# Patient Record
Sex: Male | Born: 1985 | Race: White | Hispanic: No | Marital: Single | Smoking: Never smoker
Health system: Southern US, Community
[De-identification: ages and names within clinical notes are randomized; demographics above are authoritative.]

## PROBLEM LIST (undated history)

## (undated) HISTORY — PX: ELBOW ARTHROSCOPY: SHX614

---

## 2020-02-10 ENCOUNTER — Other Ambulatory Visit: Payer: Self-pay | Admitting: Orthopedic Surgery

## 2020-02-10 DIAGNOSIS — M25521 Pain in right elbow: Secondary | ICD-10-CM

## 2020-02-22 ENCOUNTER — Ambulatory Visit
Admission: RE | Admit: 2020-02-22 | Discharge: 2020-02-22 | Disposition: A | Discharge status: Home / Self Care | Payer: BC Managed Care – PPO | Source: Ambulatory Visit | Attending: Orthopedic Surgery | Admitting: Orthopedic Surgery

## 2020-02-22 DIAGNOSIS — M25521 Pain in right elbow: Secondary | ICD-10-CM

## 2020-04-10 ENCOUNTER — Other Ambulatory Visit (HOSPITAL_COMMUNITY)
Admission: RE | Admit: 2020-04-10 | Discharge: 2020-04-10 | Disposition: A | Discharge status: Home / Self Care | Payer: BC Managed Care – PPO | Source: Ambulatory Visit | Attending: General Surgery | Admitting: General Surgery

## 2020-04-10 ENCOUNTER — Encounter (HOSPITAL_BASED_OUTPATIENT_CLINIC_OR_DEPARTMENT_OTHER): Payer: Self-pay | Admitting: Orthopaedic Surgery

## 2020-04-10 ENCOUNTER — Other Ambulatory Visit: Payer: Self-pay

## 2020-04-10 DIAGNOSIS — Z20822 Contact with and (suspected) exposure to covid-19: Secondary | ICD-10-CM | POA: Diagnosis not present

## 2020-04-10 DIAGNOSIS — Z01812 Encounter for preprocedural laboratory examination: Secondary | ICD-10-CM | POA: Diagnosis not present

## 2020-04-10 LAB — SARS CORONAVIRUS 2 (TAT 6-24 HRS): SARS Coronavirus 2: NEGATIVE

## 2020-04-10 NOTE — H&P (Signed)
PREOPERATIVE H&P  Chief Complaint: RIGHT ELBOW INFECTION  HPI: Nicholas Hart is a healthy 34 y.o. male who is scheduled for: INCISION AND DRAINAGE RIGHT ELBOW COMPLEX WOUND REPAIR.    Patient had surgery on his right elbow for lysis of adhesions and removal of osteophytes on 03/19/2020 with Dr. Everardo Pacific. His lateral stitches popped early after surgery while working on his range of motion with PT. He has been working on dealing with that with dressing changes.   His symptoms are rated as moderate to severe, and have been worsening.  This is significantly impairing activities of daily living.    Please see clinic note for further details on this patient's care.    He has elected for surgical management.   No past medical history on file.  Social History   Socioeconomic History  . Marital status: Single    Spouse name: Not on file  . Number of children: Not on file  . Years of education: Not on file  . Highest education level: Not on file  Occupational History  . Not on file  Tobacco Use  . Smoking status: Not on file  Substance and Sexual Activity  . Alcohol use: Not on file  . Drug use: Not on file  . Sexual activity: Not on file  Other Topics Concern  . Not on file  Social History Narrative  . Not on file   Social Determinants of Health   Financial Resource Strain:   . Difficulty of Paying Living Expenses:   Food Insecurity:   . Worried About Programme researcher, broadcasting/film/video in the Last Year:   . Barista in the Last Year:   Transportation Needs:   . Freight forwarder (Medical):   Marland Kitchen Lack of Transportation (Non-Medical):   Physical Activity:   . Days of Exercise per Week:   . Minutes of Exercise per Session:   Stress:   . Feeling of Stress :   Social Connections:   . Frequency of Communication with Friends and Family:   . Frequency of Social Gatherings with Friends and Family:   . Attends Religious Services:   . Active Member of Clubs or Organizations:   .  Attends Banker Meetings:   Marland Kitchen Marital Status:    No family history on file. Not on File Prior to Admission medications   Not on File    ROS: All other systems have been reviewed and were otherwise negative with the exception of those mentioned in the HPI and as above.  Physical Exam: General: Alert, no acute distress Cardiovascular: No pedal edema Respiratory: No cyanosis, no use of accessory musculature GI: No organomegaly, abdomen is soft and non-tender Skin: No lesions in the area of chief complaint Neurologic: Sensation intact distally Psychiatric: Patient is competent for consent with normal mood and affect Lymphatic: No axillary or cervical lymphadenopathy  MUSCULOSKELETAL:  RUE: Intact ulnar, median and radial distal motor and sensory function.  Range of motion is measured at 26-105 today.  Pronation and supination is still somewhat sore.  Lateral incision is gapped open about 3 centimeters, but has fibrinous tissue. There is some clear serous drainage, beefy red tissue is noted though.  Imaging: X-rays demonstrate sequela of the resection, as well as bony osteophyte, but no other abnormalities.  Assessment: RIGHT ELBOW INFECTION  Plan: Plan for Procedure(s): INCISION AND DRAINAGE RIGHT ELBOW COMPLEX WOUND REPAIR  The risks benefits and alternatives were discussed with the patient including but not  limited to the risks of nonoperative treatment, versus surgical intervention including infection, bleeding, nerve injury,  blood clots, cardiopulmonary complications, morbidity, mortality, among others, and they were willing to proceed.   The patient acknowledged the explanation, agreed to proceed with the plan and consent was signed.   Operative Plan: Right elbow irrigation and debridement. We will take cultures during surgery Discharge Medications: Tylenol, Indomethacin -> Naproxen, Oxycodone, Zofran, Bactrim   - medications sent prior to surgery  DVT  Prophylaxis: None Physical Therapy: Outpatient PT  Vernetta Honey, PA-C  04/10/2020 8:39 AM

## 2020-04-11 ENCOUNTER — Other Ambulatory Visit: Payer: Self-pay

## 2020-04-11 ENCOUNTER — Ambulatory Visit (HOSPITAL_BASED_OUTPATIENT_CLINIC_OR_DEPARTMENT_OTHER): Payer: BC Managed Care – PPO | Admitting: Anesthesiology

## 2020-04-11 ENCOUNTER — Encounter (HOSPITAL_BASED_OUTPATIENT_CLINIC_OR_DEPARTMENT_OTHER): Payer: Self-pay | Admitting: Orthopaedic Surgery

## 2020-04-11 ENCOUNTER — Encounter (HOSPITAL_BASED_OUTPATIENT_CLINIC_OR_DEPARTMENT_OTHER)
Admission: RE | Disposition: A | Discharge status: Home / Self Care | Payer: Self-pay | Source: Home / Self Care | Attending: Orthopaedic Surgery

## 2020-04-11 ENCOUNTER — Ambulatory Visit (HOSPITAL_BASED_OUTPATIENT_CLINIC_OR_DEPARTMENT_OTHER)
Admission: RE | Admit: 2020-04-11 | Discharge: 2020-04-11 | Disposition: A | Discharge status: Home / Self Care | Payer: BC Managed Care – PPO | Attending: Orthopaedic Surgery | Admitting: Orthopaedic Surgery

## 2020-04-11 DIAGNOSIS — T8130XA Disruption of wound, unspecified, initial encounter: Secondary | ICD-10-CM | POA: Insufficient documentation

## 2020-04-11 DIAGNOSIS — X58XXXA Exposure to other specified factors, initial encounter: Secondary | ICD-10-CM | POA: Diagnosis not present

## 2020-04-11 HISTORY — PX: INCISION AND DRAINAGE: SHX5863

## 2020-04-11 SURGERY — INCISION AND DRAINAGE
Anesthesia: Monitor Anesthesia Care | Site: Elbow | Laterality: Right

## 2020-04-11 MED ORDER — FENTANYL CITRATE (PF) 100 MCG/2ML IJ SOLN
INTRAMUSCULAR | Status: DC | PRN
  Administered 2020-04-11 (×2): 50 ug via INTRAVENOUS

## 2020-04-11 MED ORDER — FENTANYL CITRATE (PF) 100 MCG/2ML IJ SOLN
100.0000 ug | Freq: Once | INTRAMUSCULAR | Status: AC
  Administered 2020-04-11: 100 ug via INTRAVENOUS

## 2020-04-11 MED ORDER — FENTANYL CITRATE (PF) 100 MCG/2ML IJ SOLN
INTRAMUSCULAR | Status: AC
  Filled 2020-04-11: qty 2

## 2020-04-11 MED ORDER — PROPOFOL 10 MG/ML IV BOLUS
INTRAVENOUS | Status: AC
  Filled 2020-04-11: qty 20

## 2020-04-11 MED ORDER — BUPIVACAINE HCL (PF) 0.5 % IJ SOLN
INTRAMUSCULAR | Status: AC
  Filled 2020-04-11: qty 30

## 2020-04-11 MED ORDER — CEFAZOLIN SODIUM-DEXTROSE 2-4 GM/100ML-% IV SOLN
INTRAVENOUS | Status: AC
  Filled 2020-04-11: qty 100

## 2020-04-11 MED ORDER — MIDAZOLAM HCL 2 MG/2ML IJ SOLN
INTRAMUSCULAR | Status: AC
  Filled 2020-04-11: qty 2

## 2020-04-11 MED ORDER — DEXAMETHASONE SODIUM PHOSPHATE 10 MG/ML IJ SOLN
INTRAMUSCULAR | Status: AC
  Filled 2020-04-11: qty 1

## 2020-04-11 MED ORDER — CEFAZOLIN SODIUM-DEXTROSE 2-4 GM/100ML-% IV SOLN
2.0000 g | INTRAVENOUS | Status: AC
  Administered 2020-04-11: 2 g via INTRAVENOUS

## 2020-04-11 MED ORDER — VANCOMYCIN HCL 1000 MG IV SOLR
INTRAVENOUS | Status: AC
  Filled 2020-04-11: qty 1000

## 2020-04-11 MED ORDER — VANCOMYCIN HCL 1 G IV SOLR
INTRAVENOUS | Status: DC | PRN
  Administered 2020-04-11: 1000 mg via TOPICAL

## 2020-04-11 MED ORDER — ROPIVACAINE HCL 5 MG/ML IJ SOLN
INTRAMUSCULAR | Status: DC | PRN
  Administered 2020-04-11: 30 mL via PERINEURAL

## 2020-04-11 MED ORDER — PROPOFOL 500 MG/50ML IV EMUL
INTRAVENOUS | Status: DC | PRN
  Administered 2020-04-11: 150 ug/kg/min via INTRAVENOUS

## 2020-04-11 MED ORDER — ONDANSETRON HCL 4 MG/2ML IJ SOLN
INTRAMUSCULAR | Status: AC
  Filled 2020-04-11: qty 2

## 2020-04-11 MED ORDER — MIDAZOLAM HCL 5 MG/5ML IJ SOLN
INTRAMUSCULAR | Status: DC | PRN
  Administered 2020-04-11: 2 mg via INTRAVENOUS

## 2020-04-11 MED ORDER — MIDAZOLAM HCL 2 MG/2ML IJ SOLN
2.0000 mg | Freq: Once | INTRAMUSCULAR | Status: AC
  Administered 2020-04-11: 2 mg via INTRAVENOUS

## 2020-04-11 MED ORDER — LIDOCAINE 2% (20 MG/ML) 5 ML SYRINGE
INTRAMUSCULAR | Status: AC
  Filled 2020-04-11: qty 10

## 2020-04-11 MED ORDER — ONDANSETRON HCL 4 MG/2ML IJ SOLN
INTRAMUSCULAR | Status: DC | PRN
  Administered 2020-04-11: 4 mg via INTRAVENOUS

## 2020-04-11 MED ORDER — LACTATED RINGERS IV SOLN: INTRAVENOUS | Status: DC

## 2020-04-11 MED ORDER — PROPOFOL 10 MG/ML IV BOLUS
INTRAVENOUS | Status: DC | PRN
  Administered 2020-04-11: 50 mg via INTRAVENOUS

## 2020-04-11 MED ORDER — SODIUM CHLORIDE 0.9 % IR SOLN
Status: DC | PRN
  Administered 2020-04-11 (×2): 3000 mL

## 2020-04-11 SURGICAL SUPPLY — 75 items
BENZOIN TINCTURE PRP APPL 2/3 (GAUZE/BANDAGES/DRESSINGS) IMPLANT
BLADE HEX COATED 2.75 (ELECTRODE) IMPLANT
BLADE SURG 10 STRL SS (BLADE) ×6 IMPLANT
BLADE SURG 15 STRL LF DISP TIS (BLADE) ×2 IMPLANT
BLADE SURG 15 STRL SS (BLADE) ×4
BNDG ELASTIC 3X5.8 VLCR STR LF (GAUZE/BANDAGES/DRESSINGS) ×3 IMPLANT
BNDG ELASTIC 4X5.8 VLCR STR LF (GAUZE/BANDAGES/DRESSINGS) ×3 IMPLANT
BNDG ELASTIC 6X5.8 VLCR STR LF (GAUZE/BANDAGES/DRESSINGS) IMPLANT
BNDG GAUZE ELAST 4 BULKY (GAUZE/BANDAGES/DRESSINGS) IMPLANT
CANISTER SUCT 1200ML W/VALVE (MISCELLANEOUS) IMPLANT
CHLORAPREP W/TINT 26 (MISCELLANEOUS) IMPLANT
CLOSURE STERI-STRIP 1/2X4 (GAUZE/BANDAGES/DRESSINGS)
CLSR STERI-STRIP ANTIMIC 1/2X4 (GAUZE/BANDAGES/DRESSINGS) IMPLANT
COVER WAND RF STERILE (DRAPES) IMPLANT
CUFF TOURN SGL QUICK 34 (TOURNIQUET CUFF) ×2
CUFF TRNQT CYL 34X4.125X (TOURNIQUET CUFF) ×1 IMPLANT
DECANTER SPIKE VIAL GLASS SM (MISCELLANEOUS) IMPLANT
DRAPE EXTREMITY T 121X128X90 (DISPOSABLE) ×3 IMPLANT
DRAPE IMP U-DRAPE 54X76 (DRAPES) IMPLANT
DRAPE INCISE IOBAN 66X45 STRL (DRAPES) IMPLANT
DRAPE U-SHAPE 47X51 STRL (DRAPES) ×3 IMPLANT
DRSG MEPILEX BORDER 4X8 (GAUZE/BANDAGES/DRESSINGS) IMPLANT
DRSG PAD ABDOMINAL 8X10 ST (GAUZE/BANDAGES/DRESSINGS) IMPLANT
ELECT REM PT RETURN 9FT ADLT (ELECTROSURGICAL) ×3
ELECTRODE REM PT RTRN 9FT ADLT (ELECTROSURGICAL) ×1 IMPLANT
GAUZE PACKING IODOFORM 1/2 (PACKING) IMPLANT
GAUZE PACKING IODOFORM 1/4X5 (PACKING) IMPLANT
GAUZE SPONGE 4X4 12PLY STRL (GAUZE/BANDAGES/DRESSINGS) ×3 IMPLANT
GAUZE XEROFORM 1X8 LF (GAUZE/BANDAGES/DRESSINGS) IMPLANT
GLOVE BIO SURGEON STRL SZ 6.5 (GLOVE) ×2 IMPLANT
GLOVE BIO SURGEONS STRL SZ 6.5 (GLOVE) ×1
GLOVE BIOGEL PI IND STRL 6.5 (GLOVE) ×1 IMPLANT
GLOVE BIOGEL PI IND STRL 8 (GLOVE) ×1 IMPLANT
GLOVE BIOGEL PI INDICATOR 6.5 (GLOVE) ×2
GLOVE BIOGEL PI INDICATOR 8 (GLOVE) ×2
GLOVE ECLIPSE 8.0 STRL XLNG CF (GLOVE) ×3 IMPLANT
GOWN STRL REUS W/ TWL LRG LVL3 (GOWN DISPOSABLE) ×1 IMPLANT
GOWN STRL REUS W/TWL LRG LVL3 (GOWN DISPOSABLE) ×2
GOWN STRL REUS W/TWL XL LVL3 (GOWN DISPOSABLE) ×3 IMPLANT
HANDPIECE INTERPULSE COAX TIP (DISPOSABLE) ×2
IMMOBILIZER KNEE 22 UNIV (SOFTGOODS) IMPLANT
KIT DRSG PREVENA PLUS 7DAY 125 (MISCELLANEOUS) ×3 IMPLANT
KIT PREVENA INCISION MGT 13 (CANNISTER) ×3 IMPLANT
NDL SUT 6 .5 CRC .975X.05 MAYO (NEEDLE) IMPLANT
NEEDLE MAYO TAPER (NEEDLE)
NS IRRIG 1000ML POUR BTL (IV SOLUTION) ×3 IMPLANT
PACK BASIN DAY SURGERY FS (CUSTOM PROCEDURE TRAY) ×3 IMPLANT
PACK DSU ARTHROSCOPY (CUSTOM PROCEDURE TRAY) ×3 IMPLANT
PAD CAST 4YDX4 CTTN HI CHSV (CAST SUPPLIES) IMPLANT
PADDING CAST COTTON 4X4 STRL (CAST SUPPLIES)
PADDING CAST COTTON 6X4 STRL (CAST SUPPLIES) IMPLANT
PENCIL SMOKE EVACUATOR (MISCELLANEOUS) ×3 IMPLANT
SET HNDPC FAN SPRY TIP SCT (DISPOSABLE) ×1 IMPLANT
SET IRRIG Y TYPE TUR BLADDER L (SET/KITS/TRAYS/PACK) ×3 IMPLANT
SLEEVE SCD COMPRESS KNEE MED (MISCELLANEOUS) ×3 IMPLANT
SLING ARM FOAM STRAP XLG (SOFTGOODS) ×3 IMPLANT
SPONGE LAP 18X18 RF (DISPOSABLE) ×6 IMPLANT
SUCTION FRAZIER HANDLE 10FR (MISCELLANEOUS) ×2
SUCTION TUBE FRAZIER 10FR DISP (MISCELLANEOUS) ×1 IMPLANT
SUT ETHILON 2 0 FS 18 (SUTURE) ×3 IMPLANT
SUT ETHILON 2 0 FSLX (SUTURE) ×3 IMPLANT
SUT ETHILON 2 LR (SUTURE) ×3 IMPLANT
SUT MNCRL AB 3-0 PS2 18 (SUTURE) IMPLANT
SUT MNCRL AB 4-0 PS2 18 (SUTURE) ×3 IMPLANT
SUT PDS AB 0 CT 36 (SUTURE) ×3 IMPLANT
SUT VIC AB 0 CT1 18XCR BRD 8 (SUTURE) IMPLANT
SUT VIC AB 0 CT1 8-18 (SUTURE)
SUT VIC AB 1 CT1 27 (SUTURE)
SUT VIC AB 1 CT1 27XBRD ANBCTR (SUTURE) IMPLANT
SUT VIC AB 3-0 SH 27 (SUTURE) ×2
SUT VIC AB 3-0 SH 27X BRD (SUTURE) ×1 IMPLANT
SYR BULB EAR ULCER 3OZ GRN STR (SYRINGE) ×3 IMPLANT
TOWEL GREEN STERILE FF (TOWEL DISPOSABLE) ×9 IMPLANT
TRAY DSU PREP LF (CUSTOM PROCEDURE TRAY) ×3 IMPLANT
TUBE SUCTION HIGH CAP CLEAR NV (SUCTIONS) ×3 IMPLANT

## 2020-04-11 NOTE — Anesthesia Preprocedure Evaluation (Signed)
Anesthesia Evaluation  Patient identified by MRN, date of birth, ID band Patient awake    Reviewed: Allergy & Precautions, NPO status , Patient's Chart, lab work & pertinent test results  Airway Mallampati: II  TM Distance: >3 FB Neck ROM: Full    Dental no notable dental hx.    Pulmonary neg pulmonary ROS,    Pulmonary exam normal breath sounds clear to auscultation       Cardiovascular negative cardio ROS Normal cardiovascular exam Rhythm:Regular Rate:Normal     Neuro/Psych negative neurological ROS  negative psych ROS   GI/Hepatic negative GI ROS, Neg liver ROS,   Endo/Other  negative endocrine ROS  Renal/GU negative Renal ROS  negative genitourinary   Musculoskeletal negative musculoskeletal ROS (+)   Abdominal   Peds negative pediatric ROS (+)  Hematology negative hematology ROS (+)   Anesthesia Other Findings   Reproductive/Obstetrics negative OB ROS                             Anesthesia Physical Anesthesia Plan  ASA: I  Anesthesia Plan: MAC and Regional   Post-op Pain Management:    Induction: Intravenous  PONV Risk Score and Plan: 1 and Ondansetron and Treatment may vary due to age or medical condition  Airway Management Planned: Simple Face Mask  Additional Equipment:   Intra-op Plan:   Post-operative Plan:   Informed Consent: I have reviewed the patients History and Physical, chart, labs and discussed the procedure including the risks, benefits and alternatives for the proposed anesthesia with the patient or authorized representative who has indicated his/her understanding and acceptance.     Dental advisory given  Plan Discussed with: CRNA  Anesthesia Plan Comments:         Anesthesia Quick Evaluation

## 2020-04-11 NOTE — Op Note (Signed)
Orthopaedic Surgery Operative Note (CSN: [id number])  Nicholas Hart  1985-10-01 Date of Surgery: 04/11/2020   Diagnoses:  Right elbow wound dehiscence  Procedure: Right elbow joint irrigation debridement Right complex wound closure   Operative Finding Successful completion of the planned procedure.  Patient's wound had no obvious gross purulence but as would be expected with this lysis of the capsule initial procedure he had very little tissue between him and the joint.  We washed out the joint completely and there is deep specimens taken.  Good closure and a Prevena dressing were placed.  We anticipate his cultures will grow something as the wound was open but hopefully this allows [place] to tailor his antibiotics.  Post-operative plan: The patient will be range of motion as tolerated and 1 pound weight limit for a week with therapy to continue.  The patient will be discharged home.  DVT prophylaxis not indicated in this ambulatory upper extremity patient without significant risk factors.  He will continue Bactrim until his cultures result pain control with PRN pain medication preferring oral medicines.  Follow up plan will be scheduled in approximately 7 days for incision check and XR.  Post-Op Diagnosis: Same Surgeons:Primary: Bjorn Pippin, MD Assistants:Caroline McBane PA-C Location: [facility] OR ROOM 7 Anesthesia: Sedation plus regional anesthesia Antibiotics: Ancef 2 g with local vancomycin powder 1 g at the surgical site Tourniquet time:  Total Tourniquet Time Documented: Upper Arm (Right) - 49 minutes Total: Upper Arm (Right) - 49 minutes  Estimated Blood Loss: Minimal Complications: None Specimens: 2 for culture, right arm 1 and 2 Implants: * No implants in log *  Indications for Surgery:   Nicholas Hart is a 34 y.o. male with 3 weeks ago a open lysis of adhesions, osteophyte resection and capsular resection with ulnar nerve decompression transposition.  He had a early wound  dehiscence laterally and we attempted to treat this with local wound care and antibiotics however did not seem to be making much progress.  He had no fevers no chills and no surrounding redness.  We talked him about a wound washout and secondary closure.  Benefits and risks of operative and nonoperative management were discussed prior to surgery with patient/guardian(s) and informed consent form was completed.  Specific risks including infection, need for additional surgery, continued wound issues, infection, stiffness and heterotopic bone amongst others   Procedure:   The patient was identified properly. Informed consent was obtained and the surgical site was marked. The patient was taken up to suite where general anesthesia was induced.  The patient was positioned supine on a regular bed and a hand table.  The right arm was prepped and draped in the usual sterile fashion.  Timeout was performed before the beginning of the case.  Tourniquet was used for the above duration.  We began by using the previous partially healed lateral incision.  We did not have to extend the incision.  We opened up bluntly were able to identify some nondissolved Vicryl sutures but no obvious purulent material.  We able to identify deep within the wound beefy red tissue consistent with his healing muscular tissue however we are able to bluntly palpate down to bone and into the joint.  There is no purulent or unhealthy appearing material deep.  We did debride some fibrinous type tissue as well as excisionally debride superficial muscle, fascia and capsular type tissues.  We did perform a gentle manipulation under anesthesia and were able to get the arm to about 10 degrees  short of full extension and flexion 125 degrees.  At this point we are able to irrigate 6 L of of normal saline and ensure that the joint was clear of any debris.  Local vancomycin powder was placed.  Once we had happy with our excisional debridement of tissue  we performed a deep layer closure with PDS sutures x2 simply reapproximating the deep muscular layers avoiding many dissolving sutures deep inside the incision.  We made sure that the skin edges were freshened and excisionally debrided that previous incisional skin.  We then closed the incision with nonabsorbable sutures in a multilayer fashion using series of 2 oh and #2 nylons.  Retention sutures are placed.  We made sure that the incision did not gap open during range of motion.  A Prevena wound VAC was placed.  Sterile dressing was placed and patient awoken taken to PACU in stable condition.   Nicholas Alpers, PA-C, present and scrubbed throughout the case, critical for completion in a timely fashion, and for retraction, instrumentation, closure.

## 2020-04-11 NOTE — Transfer of Care (Signed)
Immediate Anesthesia Transfer of Care Note  Patient: Nicholas Hart  Procedure(s) Performed: INCISION AND DRAINAGE RIGHT ELBOW COMPLEX WOUND REPAIR (Right Elbow)  Patient Location: PACU  Anesthesia Type:MAC combined with regional for post-op pain  Level of Consciousness: awake, alert  and oriented  Airway & Oxygen Therapy: Patient Spontanous Breathing  Post-op Assessment: Report given to RN and Post -op Vital signs reviewed and stable  Post vital signs: Reviewed and stable  Last Vitals:  Vitals Value Taken Time  BP    Temp    Pulse 48 04/11/20 1630  Resp    SpO2 96 % 04/11/20 1630  Vitals shown include unvalidated device data.  Last Pain:  Vitals:   04/11/20 1308  TempSrc: Oral  PainSc: 0-No pain      Patients Stated Pain Goal: 3 (29/92/42 [id number])  Complications: No complications documented.

## 2020-04-11 NOTE — Anesthesia Postprocedure Evaluation (Signed)
Anesthesia Post Note  Patient: Nicholas Hart  Procedure(s) Performed: INCISION AND DRAINAGE RIGHT ELBOW COMPLEX WOUND REPAIR (Right Elbow)     Patient location during evaluation: PACU Anesthesia Type: Regional Level of consciousness: awake and alert Pain management: pain level controlled Vital Signs Assessment: post-procedure vital signs reviewed and stable Respiratory status: spontaneous breathing, nonlabored ventilation and respiratory function stable Cardiovascular status: blood pressure returned to baseline and stable Postop Assessment: no apparent nausea or vomiting Anesthetic complications: no   No complications documented.  Last Vitals:  Vitals:   04/11/20 1643 04/11/20 1645  BP: 130/83   Pulse: (!) 51 (!) 53  Resp: 15 14  Temp:    SpO2: 95% 98%    Last Pain:  Vitals:   04/11/20 1643  TempSrc:   PainSc: 0-No pain                 Lowella Curb

## 2020-04-11 NOTE — Discharge Instructions (Signed)
°Post Anesthesia Home Care Instructions ° °Activity: °Get plenty of rest for the remainder of the day. A responsible individual must stay with you for 24 hours following the procedure.  °For the next 24 hours, DO NOT: °-Drive a car °-Operate machinery °-Drink alcoholic beverages °-Take any medication unless instructed by your physician °-Make any legal decisions or sign important papers. ° °Meals: °Start with liquid foods such as gelatin or soup. Progress to regular foods as tolerated. Avoid greasy, spicy, heavy foods. If nausea and/or vomiting occur, drink only clear liquids until the nausea and/or vomiting subsides. Call your physician if vomiting continues. ° °Special Instructions/Symptoms: °Your throat may feel dry or sore from the anesthesia or the breathing tube placed in your throat during surgery. If this causes discomfort, gargle with warm salt water. The discomfort should disappear within 24 hours. ° °If you had a scopolamine patch placed behind your ear for the management of post- operative nausea and/or vomiting: ° °1. The medication in the patch is effective for 72 hours, after which it should be removed.  Wrap patch in a tissue and discard in the trash. Wash hands thoroughly with soap and water. °2. You may remove the patch earlier than 72 hours if you experience unpleasant side effects which may include dry mouth, dizziness or visual disturbances. °3. Avoid touching the patch. Wash your hands with soap and water after contact with the patch. °  ° °Regional Anesthesia Blocks ° °1. Numbness or the inability to move the "blocked" extremity may last from 3-48 hours after placement. The length of time depends on the medication injected and your individual response to the medication. If the numbness is not going away after 48 hours, call your surgeon. ° °2. The extremity that is blocked will need to be protected until the numbness is gone and the  Strength has returned. Because you cannot feel it, you will  need to take extra care to avoid injury. Because it may be weak, you may have difficulty moving it or using it. You may not know what position it is in without looking at it while the block is in effect. ° °3. For blocks in the legs and feet, returning to weight bearing and walking needs to be done carefully. You will need to wait until the numbness is entirely gone and the strength has returned. You should be able to move your leg and foot normally before you try and bear weight or walk. You will need someone to be with you when you first try to ensure you do not fall and possibly risk injury. ° °4. Bruising and tenderness at the needle site are common side effects and will resolve in a few days. ° °5. Persistent numbness or new problems with movement should be communicated to the surgeon or the [facility] ([phone])/ [facility] ([phone]). ° ° °Post Anesthesia Home Care Instructions ° °Activity: °Get plenty of rest for the remainder of the day. A responsible individual must stay with you for 24 hours following the procedure.  °For the next 24 hours, DO NOT: °-Drive a car °-Operate machinery °-Drink alcoholic beverages °-Take any medication unless instructed by your physician °-Make any legal decisions or sign important papers. ° °Meals: °Start with liquid foods such as gelatin or soup. Progress to regular foods as tolerated. Avoid greasy, spicy, heavy foods. If nausea and/or vomiting occur, drink only clear liquids until the nausea and/or vomiting subsides. Call your physician if vomiting continues. ° °Special   Instructions/Symptoms: °Your throat may feel dry or sore from the anesthesia or the breathing tube placed in your throat during surgery. If this causes discomfort, gargle with warm salt water. The discomfort should disappear within 24 hours. ° °If you had a scopolamine patch placed behind your ear for the management of post- operative nausea and/or vomiting: ° °1. The  medication in the patch is effective for 72 hours, after which it should be removed.  Wrap patch in a tissue and discard in the trash. Wash hands thoroughly with soap and water. °2. You may remove the patch earlier than 72 hours if you experience unpleasant side effects which may include dry mouth, dizziness or visual disturbances. °3. Avoid touching the patch. Wash your hands with soap and water after contact with the patch. °  °Regional Anesthesia Blocks ° °1. Numbness or the inability to move the "blocked" extremity may last from 3-48 hours after placement. The length of time depends on the medication injected and your individual response to the medication. If the numbness is not going away after 48 hours, call your surgeon. ° °2. The extremity that is blocked will need to be protected until the numbness is gone and the  Strength has returned. Because you cannot feel it, you will need to take extra care to avoid injury. Because it may be weak, you may have difficulty moving it or using it. You may not know what position it is in without looking at it while the block is in effect. ° °3. For blocks in the legs and feet, returning to weight bearing and walking needs to be done carefully. You will need to wait until the numbness is entirely gone and the strength has returned. You should be able to move your leg and foot normally before you try and bear weight or walk. You will need someone to be with you when you first try to ensure you do not fall and possibly risk injury. ° °4. Bruising and tenderness at the needle site are common side effects and will resolve in a few days. ° °5. Persistent numbness or new problems with movement should be communicated to the surgeon or the [facility] ([phone])/ [facility] ([phone]). °

## 2020-04-11 NOTE — Progress Notes (Signed)
Assisted Dr. Miller with right, ultrasound guided, supraclavicular block. Side rails up, monitors on throughout procedure. See vital signs in flow sheet. Tolerated Procedure well. 

## 2020-04-11 NOTE — Anesthesia Procedure Notes (Signed)
Anesthesia Regional Block: Supraclavicular block   Pre-Anesthetic Checklist: ,, timeout performed, Correct Patient, Correct Site, Correct Laterality, Correct Procedure, Correct Position, site marked, Risks and benefits discussed,  Surgical consent,  Pre-op evaluation,  At surgeon's request and post-op pain management  Laterality: Right  Prep: chloraprep       Needles:  Injection technique: Single-shot  Needle Type: Stimiplex     Needle Length: 9cm  Needle Gauge: 21     Additional Needles:   Procedures:,,,, ultrasound used (permanent image in chart),,,,  Narrative:  Start time: 04/11/2020 2:04 PM End time: 04/11/2020 2:09 PM Injection made incrementally with aspirations every 5 mL.  Performed by: Personally  Anesthesiologist: Lowella Curb, MD

## 2020-04-11 NOTE — Interval H&P Note (Signed)
History and Physical Interval Note:  04/11/2020 2:40 PM  Nicholas Hart  has presented today for surgery, with the diagnosis of RIGHT ELBOW INFECTION.  The various methods of treatment have been discussed with the patient and family. After consideration of risks, benefits and other options for treatment, the patient has consented to  Procedure(s): INCISION AND DRAINAGE RIGHT ELBOW COMPLEX WOUND REPAIR (Right) as a surgical intervention.  The patient's history has been reviewed, patient examined, no change in status, stable for surgery.  I have reviewed the patient's chart and labs.  Questions were answered to the patient's satisfaction.     Bjorn Pippin

## 2020-04-12 ENCOUNTER — Encounter (HOSPITAL_BASED_OUTPATIENT_CLINIC_OR_DEPARTMENT_OTHER): Payer: Self-pay | Admitting: Orthopaedic Surgery

## 2020-04-13 ENCOUNTER — Encounter (HOSPITAL_BASED_OUTPATIENT_CLINIC_OR_DEPARTMENT_OTHER): Payer: Self-pay | Admitting: Orthopaedic Surgery

## 2020-04-16 LAB — AEROBIC/ANAEROBIC CULTURE W GRAM STAIN (SURGICAL/DEEP WOUND): Culture: NO GROWTH

## 2022-04-14 IMAGING — CT CT ELBOW*R* W/O CM
4 of 6 series · 16 of 33 positions shown, 18 images · non-contrast
Comparison: None.

CLINICAL DATA: Nonspecific (abnormal) findings on radiological and
other examination of musculoskeletal system. Chronic right elbow
pain and swelling and limited range of motion since an injury
playing soccer 5-6 years ago.

EXAM:
CT OF THE UPPER RIGHT EXTREMITY WITHOUT CONTRAST;
3-DIMENSIONAL CT IMAGE RENDERING ON ACQUISITION WORKSTATION
TECHNIQUE: 3-dimensional CT images were rendered by post-processing of the
original CT data on an acquisition workstation. The 3-dimensional CT
images were interpreted and findings were reported in the
accompanying complete CT report for this study
Multidetector CT imaging of the upper right extremity was performed
according to the standard protocol.

[Series 3: elbow 1.50 br60 s3 axial bone hd fov · axial · 0.40mm/px · z∈[-781,-673]mm · 5 of 205 slices shown, 7 images]
[im 35/205  soft-tissue]
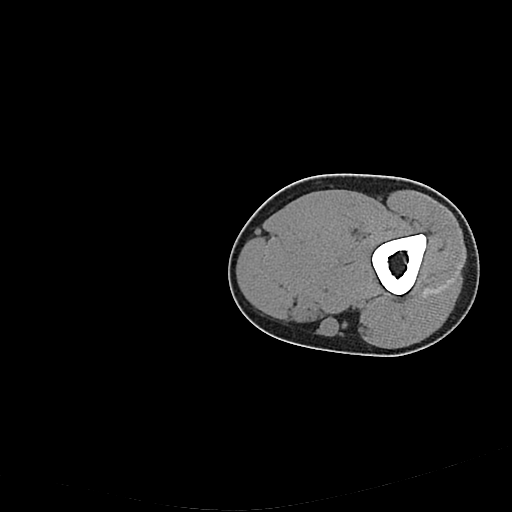
[im 35/205  bone]
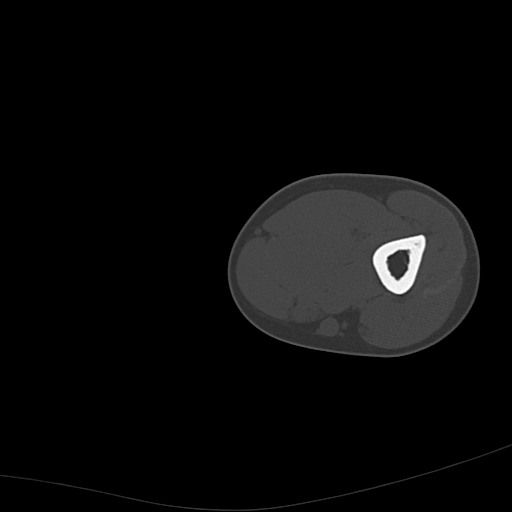
[im 69/205  bone]
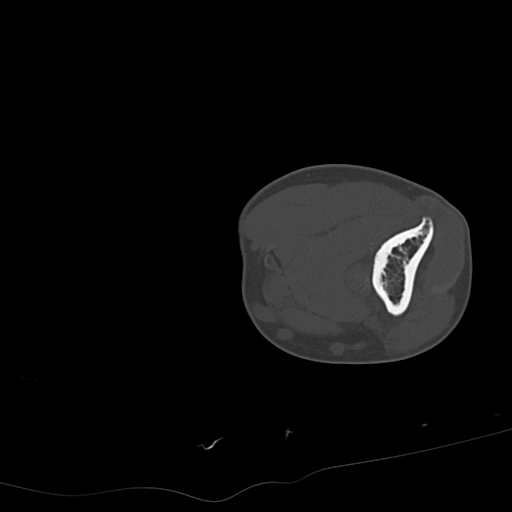
[im 103/205  bone]
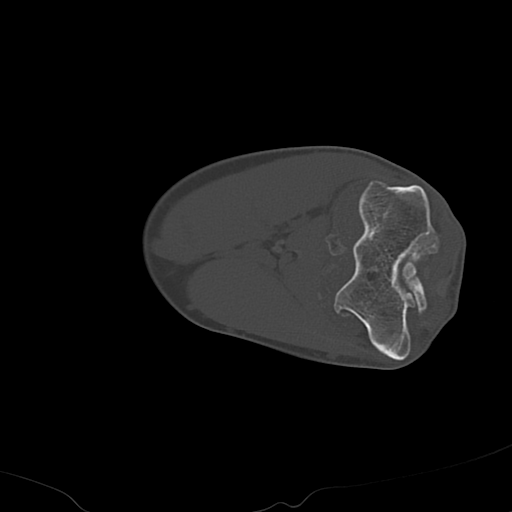
[im 137/205  bone]
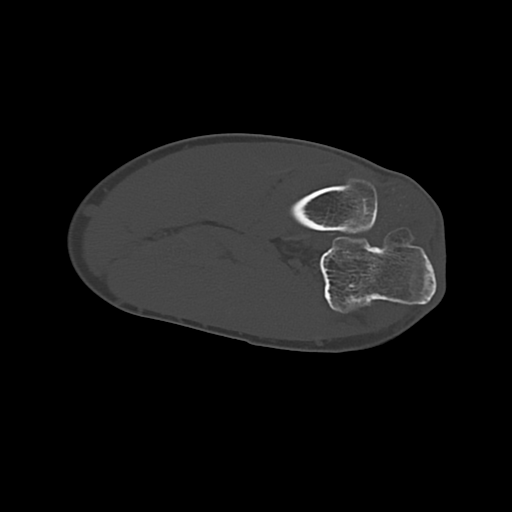
[im 171/205  soft-tissue]
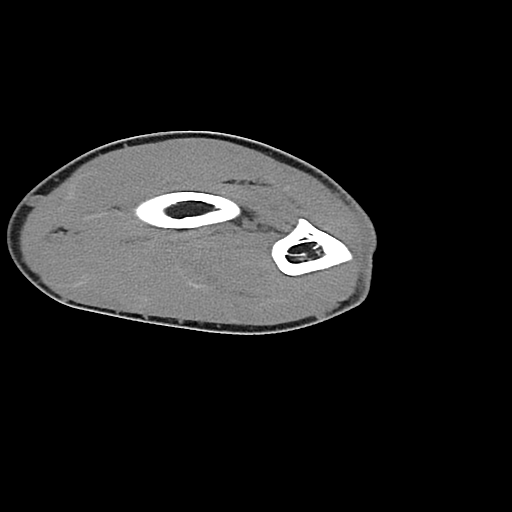
[im 171/205  bone]
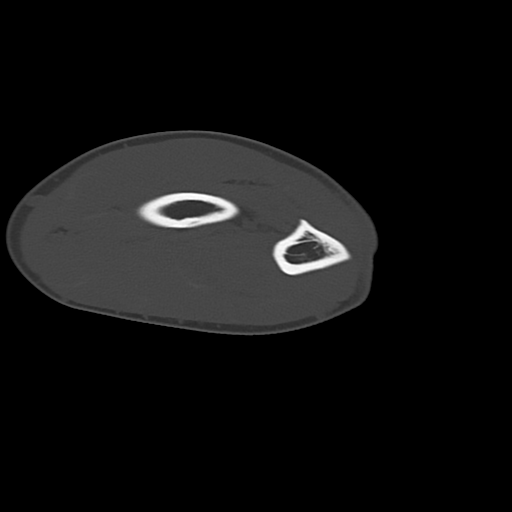

[Series 5: elbow 1.50 br40 s3 axial st hd fov · axial · 0.40mm/px · z∈[-782,-672]mm · 5 of 199 slices shown]
[im 34/199  bone]
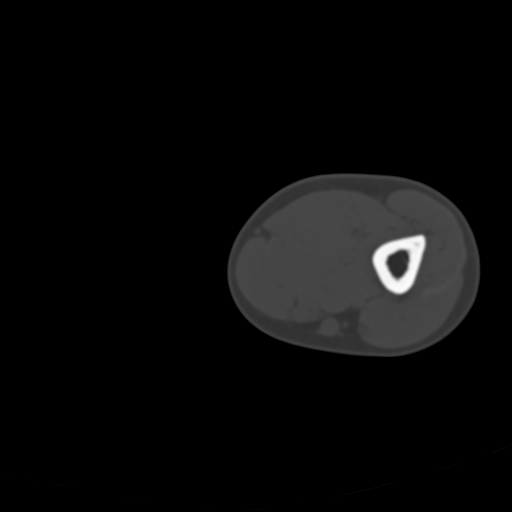
[im 67/199  bone]
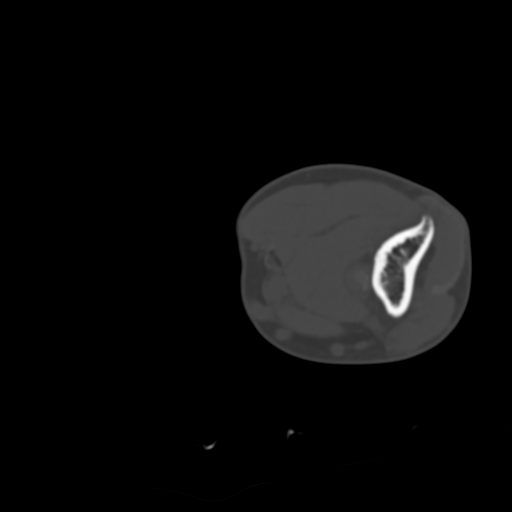
[im 100/199  bone]
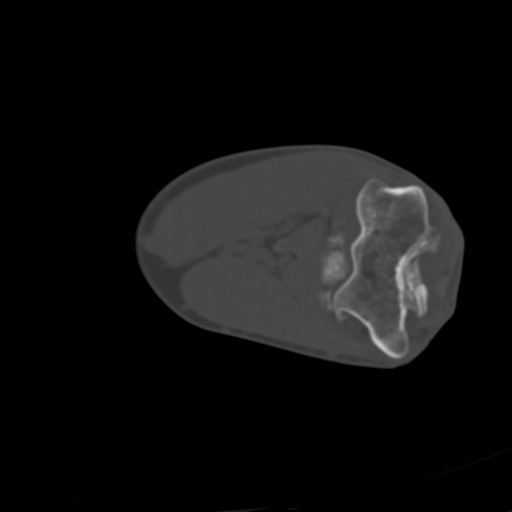
[im 133/199  bone]
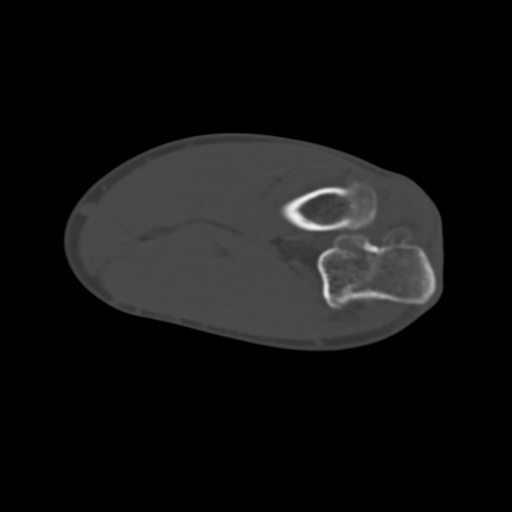
[im 166/199  bone]
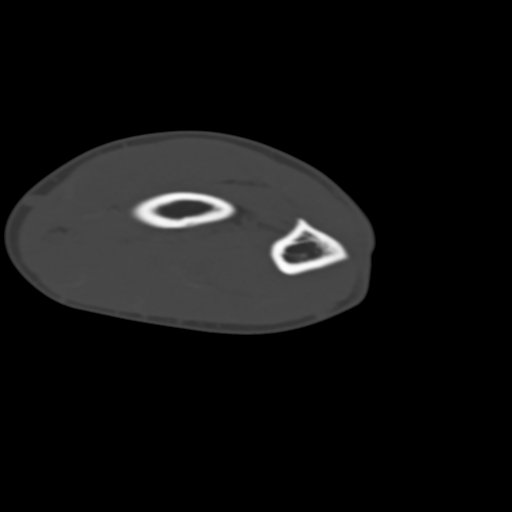

[Series 9: elbow 1.50 hr60 s3 cor bone hd fov · sagittal · 0.32mm/px · 5 of 257 slices shown]
[im 43/257  bone]
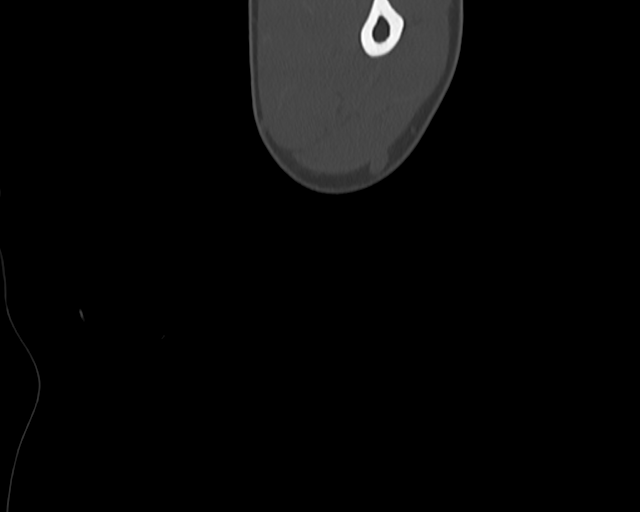
[im 86/257  bone]
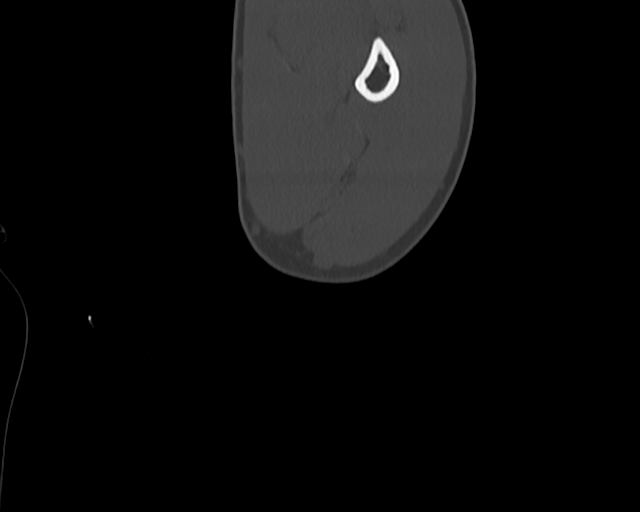
[im 129/257  bone]
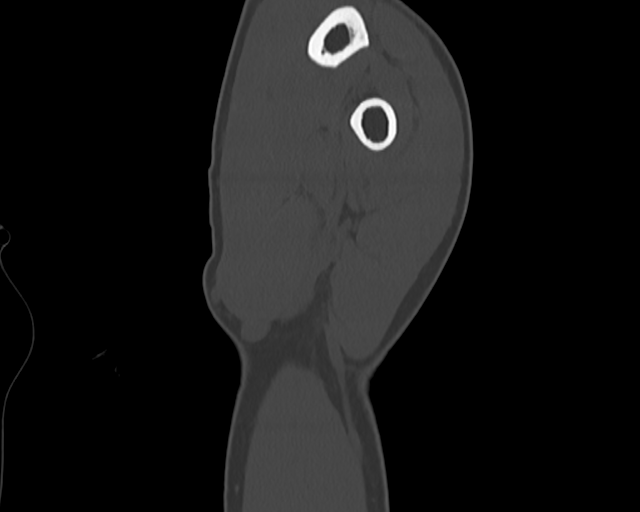
[im 171/257  bone]
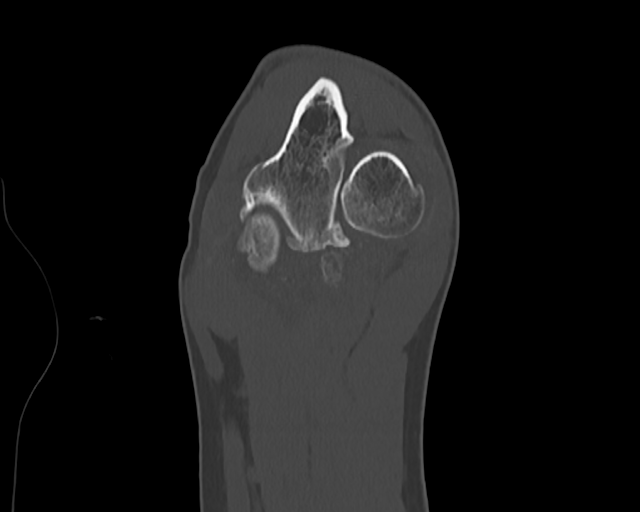
[im 214/257  bone]
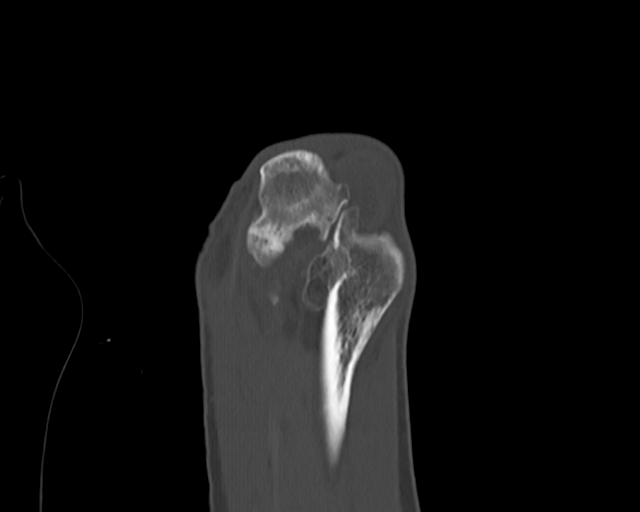

[Series 13: elbow 1.50 br60 s3 sag bone hd fov · coronal · 0.32mm/px · 1 of 190 slices shown]
[im 95/190  bone]
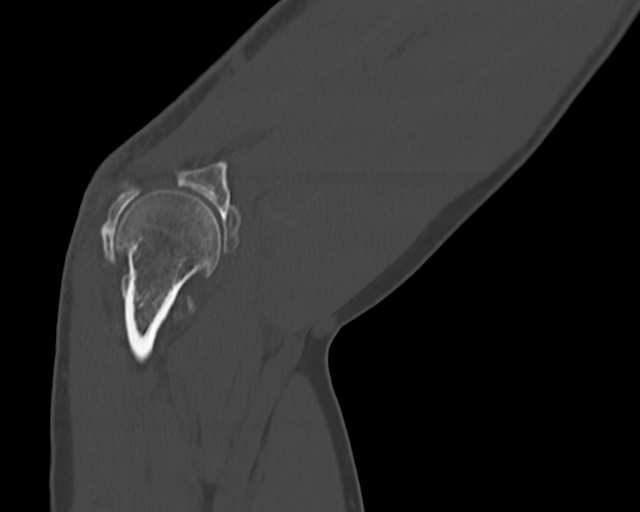

[16 of 33 positions shown; findings below may reference images not displayed]

FINDINGS: Bones/Joint/Cartilage

There is severe arthritis of the elbow joint with extensive
osteophyte formation, multiple loose bodies in the joint of various
sizes, and innumerable tiny calcifications in the distended joint.

Ligaments

Suboptimally assessed by CT. Not well enough seen for assessment.

Muscles and Tendons

No discrete abnormality of the muscles or tendons. Distal biceps
tendon and triceps tendon are intact.

Soft tissues

Extra-articular soft tissues are normal.
IMPRESSION: Severe arthritis of the elbow joint as described, likely
posttraumatic.

However, the innumerable tiny calcifications and synovial
hypertrophy in the joint could represent PVNS or synovial
osteochondromatosis with secondary arthritis.

## 2022-08-13 NOTE — Progress Notes (Unsigned)
[facility] [address] [place] [address] Phone: [phone] Subjective:   Bruce Donath, am serving as a scribe for Dr. Antoine Primas.  I'm seeing this patient by the request  of:  Patient, No Pcp Per  CC: left knee and back and hip pain   [id number]  Nicholas Hart is a 36 y.o. male coming in with complaint of back and hip pain. Patient states that he has had pain in back for 20 years. Play soccer in college and professionally for 5 years. In college he landed on back and had spondilolistheis. No feels like his hips are off and this is putting pressure on L knee. Standing and running increases his pain. Notices the entire spine shifting due to pain. Has had radiating pain but is not experiencing these symptoms recently. Patient notes limited hip mobility.   Was told that he does not have cartilage in L knee. Pain over the medial aspect of knee.  Patient gives history of loose body and did needed arthroscopic surgery done.     No past medical history on file. Past Surgical History:  Procedure Laterality Date   ELBOW ARTHROSCOPY     INCISION AND DRAINAGE Right 04/11/2020   Procedure: INCISION AND DRAINAGE RIGHT ELBOW COMPLEX WOUND REPAIR;  Surgeon: Bjorn Pippin, MD;  Location: [facility];  Service: Orthopedics;  Laterality: Right;   Social History   Socioeconomic History   Marital status: Single    Spouse name: Not on file   Number of children: Not on file   Years of education: Not on file   Highest education level: Not on file  Occupational History   Not on file  Tobacco Use   Smoking status: Never   Smokeless tobacco: Never  Vaping Use   Vaping Use: Never used  Substance and Sexual Activity   Alcohol use: Yes    Comment: occas   Drug use: Never   Sexual activity: Not on file  Other Topics Concern   Not on file  Social History Narrative   Not on file   Social Determinants of Health   Financial Resource  Strain: Not on file  Food Insecurity: Not on file  Transportation Needs: Not on file  Physical Activity: Not on file  Stress: Not on file  Social Connections: Not on file   No Known Allergies No family history on file.  Current Outpatient Medications (Endocrine & Metabolic):    predniSONE (DELTASONE) 20 MG tablet, Take 2 tablets (40 mg total) by mouth daily with breakfast.      Current Outpatient Medications (Other):    atomoxetine (STRATTERA) 80 MG capsule, Take 80 mg by mouth daily.   gabapentin (NEURONTIN) 100 MG capsule, Take 2 capsules (200 mg total) by mouth at bedtime.   Sertraline HCl (ZOLOFT PO), Take by mouth.   Reviewed prior external information including notes and imaging from  primary care provider As well as notes that were available from care everywhere and other healthcare systems.  Past medical history, social, surgical and family history all reviewed in electronic medical record.  No pertanent information unless stated regarding to the chief complaint.   Review of Systems:  No headache, visual changes, nausea, vomiting, diarrhea, constipation, dizziness, abdominal pain, skin rash, fevers, chills, night sweats, weight loss, swollen lymph nodes, body aches, joint swelling, chest pain, shortness of breath, mood changes. POSITIVE muscle aches  Objective  Blood pressure 128/88, pulse 64, height 6\' 5"  (1.956 m),  SpO2 97 %.   General: No apparent distress alert and oriented x3 mood and affect normal, dressed appropriately.  HEENT: Pupils equal, extraocular movements intact  Respiratory: Patient's speak in full sentences and does not appear short of breath  Cardiovascular: No lower extremity edema, non tender, no erythema  Low back does have some loss of lordosis.  Patient actually does have low atrophy noted of the left leg.  Limited range of motion of the hips bilaterally.  Patient has more tenderness on the right side with less than 5 degrees of internal range of  motion.  Left knee does have a varus deformity of the knee noted.  Mild tenderness to palpation over the medial joint line.  Patient has weakness with plantarflexion with 3+ out of 5 strength compared to the contralateral side.  Limited muscular skeletal ultrasound was performed and interpreted by Antoine Primas, M  Limited ultrasound of patient's left knee shows there is some narrowing of the patellofemoral and large hypoechoic changes consistent with a chronic effusion.  Does have some thickening of the synovium noted.  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   Impression and Recommendations:     The above documentation has been reviewed and is accurate and complete Judi Saa, DO

## 2022-08-14 ENCOUNTER — Ambulatory Visit (INDEPENDENT_AMBULATORY_CARE_PROVIDER_SITE_OTHER): Payer: BC Managed Care – PPO | Admitting: Family Medicine

## 2022-08-14 ENCOUNTER — Ambulatory Visit (INDEPENDENT_AMBULATORY_CARE_PROVIDER_SITE_OTHER): Payer: BC Managed Care – PPO

## 2022-08-14 ENCOUNTER — Encounter: Payer: Self-pay | Admitting: Family Medicine

## 2022-08-14 ENCOUNTER — Ambulatory Visit: Payer: Self-pay

## 2022-08-14 VITALS — BP 128/88 | HR 64 | Ht 77.0 in

## 2022-08-14 DIAGNOSIS — M545 Low back pain, unspecified: Secondary | ICD-10-CM

## 2022-08-14 DIAGNOSIS — G8929 Other chronic pain: Secondary | ICD-10-CM

## 2022-08-14 DIAGNOSIS — M25552 Pain in left hip: Secondary | ICD-10-CM

## 2022-08-14 DIAGNOSIS — M2550 Pain in unspecified joint: Secondary | ICD-10-CM

## 2022-08-14 DIAGNOSIS — M25551 Pain in right hip: Secondary | ICD-10-CM

## 2022-08-14 DIAGNOSIS — M160 Bilateral primary osteoarthritis of hip: Secondary | ICD-10-CM | POA: Insufficient documentation

## 2022-08-14 DIAGNOSIS — M25562 Pain in left knee: Secondary | ICD-10-CM | POA: Diagnosis not present

## 2022-08-14 LAB — IBC PANEL
Iron: 48 ug/dL (ref 42–165)
Saturation Ratios: 13.7 % — ABNORMAL LOW (ref 20.0–50.0)
TIBC: 350 ug/dL (ref 250.0–450.0)
Transferrin: 250 mg/dL (ref 212.0–360.0)

## 2022-08-14 LAB — URIC ACID: Uric Acid, Serum: 3.6 mg/dL — ABNORMAL LOW (ref 4.0–7.8)

## 2022-08-14 LAB — COMPREHENSIVE METABOLIC PANEL
ALT: 30 U/L (ref 0–53)
AST: 27 U/L (ref 0–37)
Albumin: 5 g/dL (ref 3.5–5.2)
Alkaline Phosphatase: 78 U/L (ref 39–117)
BUN: 11 mg/dL (ref 6–23)
CO2: 31 mEq/L (ref 19–32)
Calcium: 10.3 mg/dL (ref 8.4–10.5)
Chloride: 98 mEq/L (ref 96–112)
Creatinine, Ser: 0.88 mg/dL (ref 0.40–1.50)
GFR: 108.98 mL/min (ref >60.00)
Glucose, Bld: 119 mg/dL — ABNORMAL HIGH (ref 70–99)
Potassium: 3.9 mEq/L (ref 3.5–5.1)
Sodium: 136 mEq/L (ref 135–145)
Total Bilirubin: 0.9 mg/dL (ref 0.2–1.2)
Total Protein: 8.1 g/dL (ref 6.0–8.3)

## 2022-08-14 LAB — TESTOSTERONE: Testosterone: 296.49 ng/dL — ABNORMAL LOW (ref 300.00–890.00)

## 2022-08-14 LAB — SEDIMENTATION RATE: Sed Rate: 12 mm/hr (ref 0–15)

## 2022-08-14 LAB — CBC WITH DIFFERENTIAL/PLATELET
Basophils Absolute: 0 10*3/uL (ref 0.0–0.1)
Basophils Relative: 0.3 % (ref 0.0–3.0)
Eosinophils Absolute: 0 10*3/uL (ref 0.0–0.7)
Eosinophils Relative: 0.4 % (ref 0.0–5.0)
HCT: 45.4 % (ref 39.0–52.0)
Hemoglobin: 15.4 g/dL (ref 13.0–17.0)
Lymphocytes Relative: 11.4 % — ABNORMAL LOW (ref 12.0–46.0)
Lymphs Abs: 1 10*3/uL (ref 0.7–4.0)
MCHC: 34 g/dL (ref 30.0–36.0)
MCV: 89.4 fl (ref 78.0–100.0)
Monocytes Absolute: 0.7 10*3/uL (ref 0.1–1.0)
Monocytes Relative: 7.5 % (ref 3.0–12.0)
Neutro Abs: 7.3 10*3/uL (ref 1.4–7.7)
Neutrophils Relative %: 80.4 % — ABNORMAL HIGH (ref 43.0–77.0)
Platelets: 319 10*3/uL (ref 150.0–400.0)
RBC: 5.08 Mil/uL (ref 4.22–5.81)
RDW: 11.7 % (ref 11.5–15.5)
WBC: 9 10*3/uL (ref 4.0–10.5)

## 2022-08-14 LAB — C-REACTIVE PROTEIN: CRP: <1 mg/dL (ref 0.5–20.0)

## 2022-08-14 LAB — FERRITIN: Ferritin: 121.2 ng/mL (ref 22.0–322.0)

## 2022-08-14 LAB — VITAMIN D 25 HYDROXY (VIT D DEFICIENCY, FRACTURES): VITD: 33.95 ng/mL (ref 30.00–100.00)

## 2022-08-14 LAB — VITAMIN B12: Vitamin B-12: 231 pg/mL (ref 211–911)

## 2022-08-14 LAB — TSH: TSH: 1.16 u[IU]/mL (ref 0.35–5.50)

## 2022-08-14 MED ORDER — PREDNISONE 20 MG PO TABS: 40.0000 mg | ORAL_TABLET | Freq: Every day | ORAL | 0 refills | Status: AC

## 2022-08-14 MED ORDER — GABAPENTIN 100 MG PO CAPS: 200.0000 mg | ORAL_CAPSULE | Freq: Every day | ORAL | 0 refills | Status: DC

## 2022-08-14 NOTE — Patient Instructions (Addendum)
Gabapentin 200mg  at night Prednisone 40mg  for 7 days Exercises 3x a week Xray and lab on the way out See me in 4 weeks (ok to double book)

## 2022-08-14 NOTE — Assessment & Plan Note (Signed)
Significant hip joint arthritis, patient also has unfortunately what appears to be significant arthritic changes when reviewing his chart of the elbow.  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  Weakness of the left lower extremity.  Strong family history of rheumatoid arthritis in his mother.  I am concerned for potential inflammatory arthropathy.  We will get laboratory work-up to further evaluate.  Prednisone and gabapentin given now to help with some of the pain.  Looking at patient's hips I do think that there is a strong possibility that surgical intervention will be necessary which would either be possible resurfacing versus total hip replacement.  At the moment though we are going to try to find other things that would help [place] with more of a direction for his long-term care and quality of life before surgical intervention.  Depending on laboratory work-up and how patient responds to the steroids patient will follow-up with me again in 3 to 4 weeks to see how he is responding as well as right knee through MyChart to keep me updated.

## 2022-08-15 ENCOUNTER — Encounter: Payer: Self-pay | Admitting: Family Medicine

## 2022-08-17 LAB — RHEUMATOID FACTOR: Rheumatoid fact SerPl-aCnc: <14 IU/mL (ref <14)

## 2022-08-17 LAB — PTH, INTACT AND CALCIUM
Calcium: 10.7 mg/dL — ABNORMAL HIGH (ref 8.6–10.3)
PTH: 16 pg/mL (ref 16–77)

## 2022-08-17 LAB — CYCLIC CITRUL PEPTIDE ANTIBODY, IGG: Cyclic Citrullin Peptide Ab: <16 UNITS

## 2022-08-17 LAB — ANGIOTENSIN CONVERTING ENZYME: Angiotensin-Converting Enzyme: 40 U/L (ref 9–67)

## 2022-08-17 LAB — ANA: Anti Nuclear Antibody (ANA): NEGATIVE

## 2022-08-17 LAB — CALCIUM, IONIZED: Calcium, Ion: 5.3 mg/dL (ref 4.7–5.5)

## 2022-08-17 LAB — HLA-B27 ANTIGEN: HLA-B27 Antigen: NEGATIVE

## 2022-08-26 ENCOUNTER — Encounter: Payer: Self-pay | Admitting: Family Medicine

## 2022-09-09 NOTE — Progress Notes (Unsigned)
[facility] [address] [place] [address] Phone: [phone] Subjective:   Bruce Donath, am serving as a scribe for Dr. Antoine Primas.  I'm seeing this patient by the request  of:  Patient, No Pcp Per  CC: Hip and back pain follow-up  [id number]  08/14/2022 Significant hip joint arthritis, patient also has unfortunately what appears to be significant arthritic changes when reviewing his chart of the elbow.  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  Weakness of the left lower extremity.  Strong family history of rheumatoid arthritis in his mother.  I am concerned for potential inflammatory arthropathy.  We will get laboratory work-up to further evaluate.  Prednisone and gabapentin given now to help with some of the pain.  Looking at patient's hips I do think that there is a strong possibility that surgical intervention will be necessary which would either be possible resurfacing versus total hip replacement.  At the moment though we are going to try to find other things that would help [place] with more of a direction for his long-term care and quality of life before surgical intervention.  Depending on laboratory work-up and how patient responds to the steroids patient will follow-up with me again in 3 to 4 weeks to see how he is responding as well as right knee through MyChart to keep me updated.      Update 09/11/2022 Sadarius Norman is a 36 y.o. male coming in with complaint of L knee, low back pain, and B hip pain. Patient states that he uses gabapentin at night and it is helps. Continues to have issues with not being able to distribute weight in legs evenly due to pain in hips.    Hip pain does have significant limited range of motion bilaterally.  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  Also moderate degenerative disc disease of the lumbar spine.   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  Autoimmune labs were unremarkable.    History reviewed. No pertinent past medical history. Past Surgical History:  Procedure Laterality Date   ELBOW ARTHROSCOPY     INCISION AND DRAINAGE Right 04/11/2020   Procedure: INCISION AND DRAINAGE RIGHT ELBOW COMPLEX WOUND REPAIR;  Surgeon: Bjorn Pippin, MD;  [facility];  Service: Orthopedics;  Laterality: Right;   Social History   Socioeconomic History   Marital status: Single    Spouse name: Not on file   Number of children: Not on file   Years of education: Not on file   Highest education level: Not on file  Occupational History   Not on file  Tobacco Use   Smoking status: Never   Smokeless tobacco: Never  Vaping Use   Vaping Use: Never used  Substance and Sexual Activity   Alcohol use: Yes    Comment: occas   Drug use: Never   Sexual activity: Not on file  Other Topics Concern   Not on file  Social History Narrative   Not on file   Social Determinants of Health   Financial Resource Strain: Not on file  Food Insecurity: Not on file  Transportation Needs: Not on file  Physical Activity: Not on file  Stress: Not on file  Social Connections: Not on file   No Known Allergies History reviewed. No pertinent family history.  Current Outpatient Medications (Endocrine & Metabolic):  predniSONE (DELTASONE) 20 MG tablet, Take 2 tablets (40 mg total) by mouth daily with breakfast.      Current Outpatient Medications (Other):    atomoxetine (STRATTERA) 80 MG capsule, Take 80 mg by mouth daily.   gabapentin (NEURONTIN) 100 MG capsule, Take 2 capsules (200 mg total) by mouth at bedtime.   Sertraline HCl (ZOLOFT PO), Take by mouth.   Reviewed prior external information including notes and imaging from  primary care provider As well as notes that were available from care everywhere and other healthcare systems.  Past medical history, social,  surgical and family history all reviewed in electronic medical record.  No pertanent information unless stated regarding to the chief complaint.   Review of Systems:  No headache, visual changes, nausea, vomiting, diarrhea, constipation, dizziness, abdominal pain, skin rash, fevers, chills, night sweats, weight loss, swollen lymph nodes, , joint swelling, chest pain, shortness of breath, mood changes. POSITIVE muscle aches, body aches  Objective  Blood pressure (!) 132/92, pulse (!) 46, height 6\' 5"  (1.956 m), weight 211 lb (95.7 kg).   General: No apparent distress alert and oriented x3 mood and affect normal, dressed appropriately.  HEENT: Pupils equal, extraocular movements intact  Respiratory: Patient's speak in full sentences and does not appear short of breath  Cardiovascular: No lower extremity edema, non tender, no erythema  Severely antalgic gait noted.  Has 0 degrees of internal rotation of the hip with external rotation.  5 degrees of internal rotation on the left    Impression and Recommendations:     The above documentation has been reviewed and is accurate and complete , DO

## 2022-09-11 ENCOUNTER — Ambulatory Visit: Payer: BC Managed Care – PPO | Admitting: Family Medicine

## 2022-09-11 ENCOUNTER — Encounter: Payer: Self-pay | Admitting: Family Medicine

## 2022-09-11 VITALS — BP 132/92 | HR 46 | Ht 77.0 in | Wt 211.0 lb

## 2022-09-11 DIAGNOSIS — M25552 Pain in left hip: Secondary | ICD-10-CM | POA: Diagnosis not present

## 2022-09-11 DIAGNOSIS — M25551 Pain in right hip: Secondary | ICD-10-CM | POA: Diagnosis not present

## 2022-09-11 DIAGNOSIS — M1712 Unilateral primary osteoarthritis, left knee: Secondary | ICD-10-CM

## 2022-09-11 DIAGNOSIS — M160 Bilateral primary osteoarthritis of hip: Secondary | ICD-10-CM | POA: Diagnosis not present

## 2022-09-11 NOTE — Assessment & Plan Note (Addendum)
Significant hip arthritis bilaterally.  Does have the weakness of the lower extremities as well.  We discussed the significant arthritic changes also at the L5-S1.  Patient though at this moment and seems to be the priority do something for the right hip with the significant loss of range of motion, weakness as well as how much it is affecting daily activities.  Patient is also starting to have potentially some thinning of the acetabulum so I do think there is a possibility of the necessary getting this done.  Unfortunately patient continues to not know what potentially because of the arthritic changes.  Does have a strong family history of autoimmune disease but laboratory workup was unremarkable at the moment.  Mild decrease in testosterone but not enough to cause this in this young individual.  Patient will be referred to orthopedic surgery to discuss replacement at this time.  Patient was accompanied with wife and all questions were answered, went over the imaging together as well.  Follow-up with me again after he discusses with orthopedic surgery and likely after surgery itself.  Total time with patient 37 minutes

## 2022-09-11 NOTE — Patient Instructions (Addendum)
Dr. Jerl Santos [phone] Keep taking gabapentin Keep me updated on what is going on

## 2022-09-11 NOTE — Assessment & Plan Note (Signed)
Arthritis of the left knee noted.  Discussed icing regimen and home exercises.  We discussed that the medial compartment at some point may need

## 2022-10-08 ENCOUNTER — Encounter: Payer: Self-pay | Admitting: Family Medicine

## 2022-11-06 ENCOUNTER — Other Ambulatory Visit: Payer: Self-pay | Admitting: Family Medicine

## 2022-11-08 ENCOUNTER — Encounter: Payer: Self-pay | Admitting: Family Medicine
